# Patient Record
Sex: Male | Born: 2000 | Race: Black or African American | Hispanic: No | Marital: Single | Smoking: Never smoker
Health system: Southern US, Community
[De-identification: ages and names within clinical notes are randomized; demographics above are authoritative.]

## PROBLEM LIST (undated history)

## (undated) DIAGNOSIS — J45909 Unspecified asthma, uncomplicated: Secondary | ICD-10-CM

## (undated) HISTORY — PX: TONSILLECTOMY: SUR1361

---
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
## 2023-09-04 ENCOUNTER — Encounter (HOSPITAL_COMMUNITY): Payer: Self-pay | Admitting: Emergency Medicine

## 2023-09-04 ENCOUNTER — Ambulatory Visit (HOSPITAL_COMMUNITY)
Admission: EM | Admit: 2023-09-04 | Discharge: 2023-09-04 | Disposition: A | Discharge status: Home / Self Care | Payer: Self-pay | Attending: Neurology | Admitting: Neurology

## 2023-09-04 ENCOUNTER — Ambulatory Visit (INDEPENDENT_AMBULATORY_CARE_PROVIDER_SITE_OTHER): Payer: Self-pay

## 2023-09-04 DIAGNOSIS — M25512 Pain in left shoulder: Secondary | ICD-10-CM

## 2023-09-04 MED ORDER — CYCLOBENZAPRINE HCL 10 MG PO TABS: 10.0000 mg | ORAL_TABLET | Freq: Two times a day (BID) | ORAL | 0 refills | Status: AC | PRN

## 2023-09-04 MED ORDER — ACETAMINOPHEN 500 MG PO TABS: 500.0000 mg | ORAL_TABLET | Freq: Four times a day (QID) | ORAL | 0 refills | Status: AC | PRN
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
## 2024-01-30 ENCOUNTER — Emergency Department: Payer: Self-pay

## 2024-01-30 ENCOUNTER — Emergency Department
Admission: EM | Admit: 2024-01-30 | Discharge: 2024-01-30 | Disposition: A | Discharge status: Home / Self Care | Payer: Self-pay | Attending: Emergency Medicine | Admitting: Emergency Medicine

## 2024-01-30 ENCOUNTER — Other Ambulatory Visit: Payer: Self-pay

## 2024-01-30 DIAGNOSIS — S93492A Sprain of other ligament of left ankle, initial encounter: Secondary | ICD-10-CM

## 2024-01-30 DIAGNOSIS — J45909 Unspecified asthma, uncomplicated: Secondary | ICD-10-CM | POA: Insufficient documentation

## 2024-01-30 DIAGNOSIS — Y9301 Activity, walking, marching and hiking: Secondary | ICD-10-CM | POA: Insufficient documentation

## 2024-01-30 DIAGNOSIS — X501XXA Overexertion from prolonged static or awkward postures, initial encounter: Secondary | ICD-10-CM | POA: Insufficient documentation

## 2024-01-30 DIAGNOSIS — S93432A Sprain of tibiofibular ligament of left ankle, initial encounter: Secondary | ICD-10-CM | POA: Insufficient documentation

## 2024-01-30 DIAGNOSIS — M25472 Effusion, left ankle: Secondary | ICD-10-CM | POA: Insufficient documentation

## 2024-01-30 NOTE — ED Triage Notes (Signed)
 Pt to ED for L ankle injury on 7/15 while walking down stairs. Photo oh phone shows large visible deformity/swelling to anterior ankle. PA examining pt in triage. Much less swollen now. Pt is ambulatory but ankle is tender.
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
## 2024-01-30 NOTE — Discharge Instructions (Addendum)
 You were seen in the emergency department for a sprain of your left ankle and joint effusion.  Please wear the boot and use crutches at home until you can follow-up with your primary care provider listed in this paperwork. You may use ice to help with swelling, heat, ibuprofen and tylenol  based on the instructions on the bottle at home to help with your pain.  Please return to the emergency department if you experience fever, swelling trailing up your leg, redness, or pus-like drainage, or for any new or worsening symptoms.

## 2024-01-30 NOTE — ED Provider Notes (Signed)
 [facility] Provider Note    Event Date/Time   First MD Initiated Contact with Patient 01/30/24 1616     (approximate)   History   Ankle Injury   HPI  Eddie Montoya is a 23 y.o. male  with a past medical history of asthma presents to the emergency department with left ankle injury that occurred on 7/15 while walking down stairs.  Patient states he inverted his left ankle and has had some swelling and pain on the lateral aspect since then.  Patient has been using Tylenol  and over-the-counter creams and ice at home with minimal relief.  He has had 2 inversion ankle sprains before on the left extremity. Denies fever, erythema, pus-like discharge, numbness, tingling, fall.      Physical Exam   Triage Vital Signs: ED Triage Vitals  Encounter Vitals Group     BP 01/30/24 1555 100/85     Girls Systolic BP Percentile --      Girls Diastolic BP Percentile --      Boys Systolic BP Percentile --      Boys Diastolic BP Percentile --      Pulse Rate 01/30/24 1555 78     Resp 01/30/24 1555 20     Temp 01/30/24 1555 98.2 F (36.8 C)     Temp Source 01/30/24 1555 Oral     SpO2 01/30/24 1555 100 %     Weight 01/30/24 1557 170 lb (77.1 kg)     Height 01/30/24 1557 5' 11 (1.803 m)     Head Circumference --      Peak Flow --      Pain Score 01/30/24 1557 5     Pain Loc --      Pain Education --      Exclude from Growth Chart --     Most recent vital signs: Vitals:   01/30/24 1555  BP: 100/85  Pulse: 78  Resp: 20  Temp: 98.2 F (36.8 C)  SpO2: 100%    General: Awake, in no acute distress. Appears stated age. CV: Regular rate, 78 bpm. Dorsalis pedis pulses 2+ and symmetric.  Respiratory: No respiratory distress. Normal respiratory effort. GI: Soft, non-distended, non-tender. No rebound or guarding.  MSK: Normal ROM and 5/5 strength in b/l ankles and toes. Able to plantarflex, dorsiflexion, invert, evert, and flex toes with ease. TTP along distal aspect  of lateral malleolus of left extremity.  Skin:Warm, dry, intact. Edema noted to distal aspect of left lateral malleolus. Neurological: A&Ox4 to person, place, time, and situation. Sensation intact. Strength symmetric. No focal deficits.  ED Results / Procedures / Treatments   Labs (all labs ordered are listed, but only abnormal results are displayed) Labs Reviewed - No data to display   EKG     RADIOLOGY X ray left ankle ordered.  Impression 1. Linear lucency tracks along the medial malleolus suspicious for a nondisplaced fracture, although the lack of overlying soft tissue swelling is atypical. Correlate with point tenderness over the medial malleolus. If further imaging characterization is indicated, CT of the ankle would be recommended. 2. Equivocal tibiotalar joint effusion. 3. Mild soft tissue swelling over the lateral malleolus and anterior to the tibiotalar joint.  PROCEDURES:  Critical Care performed: No   Procedures   MEDICATIONS ORDERED IN ED: Medications - No data to display   IMPRESSION / MDM / ASSESSMENT AND PLAN / ED COURSE  I reviewed the triage vital signs and the nursing notes.  Differential diagnosis includes, but is not limited to, ATFL sprain of left ankle, joint effusion, distal fibula or tibia fracture, cellulitis,   Patient's presentation is most consistent with acute complicated illness / injury requiring diagnostic workup.  Patient is a 23 year old male who presented today with left ankle pain it has been going on for almost 2 weeks after sustaining inversion injury.  X-ray of the left ankle shows mild soft tissue swelling over the lateral malleolus, tibiotalar joint effusion.  Radiologist also had suspicion for nondisplaced fracture of the medial malleolus, but the patient has no point tenderness on the side with or without motions.  Therefore, I did not think a CT was warranted. I personally viewed and interpreted  this x-ray; agree with the radiologist's findings.  No fever, intolerable pain, puslike discharge, tracking erythema, less likely a septic joint. Patient was placed in Aircast boot and provided crutches.  He can use Tylenol  and ibuprofen at home for pain.  I would like him to follow-up with his primary care provider.  Patient was given the opportunity to ask questions; all questions were answered. Emergency department return precautions were discussed with the patient.  Patient is in agreement to the treatment plan.  Patient is stable for discharge.    FINAL CLINICAL IMPRESSION(S) / ED DIAGNOSES   Final diagnoses:  Ankle joint effusion, left  Sprain of anterior talofibular ligament of left ankle, initial encounter     Rx / DC Orders   ED Discharge Orders     None        Note:  This document was prepared using Dragon voice recognition software and may include unintentional dictation errors.     Sheron Salm, PA-C 01/30/24 1744    Arlander Charleston, MD 01/30/24 [phone]

## 2024-05-05 ENCOUNTER — Emergency Department (HOSPITAL_COMMUNITY)
Admission: EM | Admit: 2024-05-05 | Discharge: 2024-05-05 | Disposition: A | Discharge status: Home / Self Care | Payer: Self-pay

## 2024-05-05 ENCOUNTER — Encounter (HOSPITAL_COMMUNITY): Payer: Self-pay

## 2024-05-05 ENCOUNTER — Other Ambulatory Visit: Payer: Self-pay

## 2024-05-05 DIAGNOSIS — Z91010 Allergy to peanuts: Secondary | ICD-10-CM | POA: Insufficient documentation

## 2024-05-05 DIAGNOSIS — T782XXA Anaphylactic shock, unspecified, initial encounter: Secondary | ICD-10-CM | POA: Insufficient documentation

## 2024-05-05 MED ORDER — FAMOTIDINE IN NACL 20-0.9 MG/50ML-% IV SOLN
20.0000 mg | Freq: Once | INTRAVENOUS | Status: AC
  Administered 2024-05-05: 20 mg via INTRAVENOUS
  Filled 2024-05-05: qty 50

## 2024-05-05 MED ORDER — LACTATED RINGERS IV BOLUS
1000.0000 mL | Freq: Once | INTRAVENOUS | Status: AC
  Administered 2024-05-05: 1000 mL via INTRAVENOUS

## 2024-05-05 MED ORDER — HYDROXYZINE HCL 25 MG PO TABS
25.0000 mg | ORAL_TABLET | Freq: Once | ORAL | Status: AC
  Administered 2024-05-05: 25 mg via ORAL
  Filled 2024-05-05: qty 1

## 2024-05-05 MED ORDER — EPINEPHRINE 0.3 MG/0.3ML IJ SOAJ: 0.3000 mg | INTRAMUSCULAR | 0 refills | Status: AC | PRN

## 2024-05-05 NOTE — ED Provider Notes (Signed)
 [place] EMERGENCY DEPARTMENT AT [facility] Provider Note   CSN: [id number] Arrival date & time: 05/05/24  1753     Patient presents with: Allergic Reaction   Eddie Montoya is a 23 y.o. male.   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  The patient was having difficulty breathing and swallowing initially.  Medics arrived and gave the patient Solu-Medrol as well as IV Benadryl and IM epinephrine.  The patient reports that the difficulty breathing and swallowing has improved significantly and he is back to his baseline with that.  He states he still having significant itching and hives.  He denies any chest pain or shortness of breath currently.   Allergic Reaction Presenting symptoms: difficulty swallowing and rash        Prior to Admission medications   Medication Sig Start Date End Date Taking? Authorizing Provider  EPINEPHrine 0.3 mg/0.3 mL IJ SOAJ injection Inject 0.3 mg into the muscle as needed for anaphylaxis. 05/05/24  Yes Ula Prentice SAUNDERS, MD  acetaminophen  (TYLENOL ) 500 MG tablet Take 1 tablet (500 mg total) by mouth every 6 (six) hours as needed. 09/04/23   Remi Pippin, NP  cetirizine  (ZYRTEC  ALLERGY) 10 MG tablet Take 1 tablet (10 mg total) by mouth daily. 10/05/21 01/13/22  Renae Bernarda HERO, PA-C  cyclobenzaprine  (FLEXERIL ) 10 MG tablet Take 1 tablet (10 mg total) by mouth 2 (two) times daily as needed for muscle spasms. 09/04/23   Remi Pippin, NP  fluticasone  (FLONASE  ALLERGY RELIEF) 50 MCG/ACT nasal spray Place 1 spray into both nostrils daily for 14 days. 10/05/21 11/08/21  Renae Bernarda HERO, PA-C  ondansetron  (ZOFRAN -ODT) 4 MG disintegrating tablet Dissolve 1 tablets under the tongue once every 4 hours as needed for nausea/vomiting 10/01/21   Kehrli, Kelsey F, PA-C    Allergies: Peanut-containing drug  products and Egg protein-containing drug products    Review of Systems  HENT:  Positive for trouble swallowing.   Skin:  Positive for rash.  All other systems reviewed and are negative.   Updated Vital Signs BP (!) 107/58 (BP Location: Left Arm)   Pulse 77   Temp 98.8 F (37.1 C) (Oral)   Resp 20   Ht 5' 11 (1.803 m)   Wt 79.4 kg   SpO2 100%   BMI 24.41 kg/m   Physical Exam Vitals and nursing note reviewed.   Gen: NAD Eyes: PERRL, EOMI HEENT: no oropharyngeal swelling noted Neck: trachea midline no stridor Resp: clear to auscultation bilaterally Card: RRR, no murmurs, rubs, or gallops Abd: nontender, nondistended Extremities: no calf tenderness, no edema Vascular: 2+ radial pulses bilaterally, 2+ DP pulses bilaterally Skin: Diffuse urticarial rash noted over the arms, legs, and face with signs of excoriation Psyc: acting appropriately   (all labs ordered are listed, but only abnormal results are displayed) Labs Reviewed - No data to display  EKG: None  Radiology: No results found.   Procedures   Medications Ordered in the ED  famotidine (PEPCID) IVPB 20 mg premix (0 mg Intravenous Stopped 05/05/24 1918)  hydrOXYzine (ATARAX) tablet 25 mg (25 mg Oral Given 05/05/24 1847)  lactated ringers bolus 1,000 mL (0 mLs Intravenous Stopped 05/05/24 2121)  Medical Decision Making 23 year old male with past medical history of peanut butter allergy presenting to the emergency department today with anaphylaxis.  The patient has improved after the initial IM epinephrine with medics in regards to his airway.  Will also give the patient Pepcid in addition to the Solu-Medrol and Benadryl.  Given the itching we will also give him a dose of hydroxyzine here.  Will monitor the patient closely for need for redosing of the epinephrine as this does appear to be a severe allergic reaction.  I will reevaluate for ultimate disposition.  The patient  remained stable while here in the emergency department.  He eloped from the emergency department before his observation period was up.  I did send a prescription for an EpiPen to his pharmacy on record.  I did try to call the patient at his number listed as well as his emergency contact to make him aware but I was unable to get through to the patient or his emergency contact but sent this anyway so hopefully his pharmacy will be able to reach him.  Risk Prescription drug management.        Final diagnoses:  Anaphylaxis, initial encounter    ED Discharge Orders          Ordered    EPINEPHrine 0.3 mg/0.3 mL IJ SOAJ injection  As needed        05/05/24 2123               Ula Prentice SAUNDERS, MD 05/05/24 2125

## 2024-05-05 NOTE — ED Triage Notes (Signed)
 Pt BIB ems after having an allergic rxn to peanut butter. Felt itching, hives, SOB, and throat tightness at the time of the event. EMS gave 0.3mg  of epi, 50mg  benadryl, 125mg  solu-medral. Pt states feeling better with the SOB and throat but continues to itch. A&Ox4 VS stable
